# Patient Record
Sex: Female | Born: 1975 | Race: White | Hispanic: No | Marital: Single | Smoking: Current every day smoker
Health system: Southern US, Community
[De-identification: ages and names within clinical notes are randomized; demographics above are authoritative.]

---

## 2008-08-27 ENCOUNTER — Emergency Department: Payer: Self-pay | Admitting: Emergency Medicine

## 2015-10-14 ENCOUNTER — Encounter: Payer: Self-pay | Admitting: Emergency Medicine

## 2015-10-14 ENCOUNTER — Emergency Department: Payer: 59

## 2015-10-14 ENCOUNTER — Emergency Department
Admission: EM | Admit: 2015-10-14 | Discharge: 2015-10-14 | Disposition: A | Discharge status: Home / Self Care | Payer: 59 | Attending: Emergency Medicine | Admitting: Emergency Medicine

## 2015-10-14 DIAGNOSIS — F17210 Nicotine dependence, cigarettes, uncomplicated: Secondary | ICD-10-CM | POA: Insufficient documentation

## 2015-10-14 DIAGNOSIS — R42 Dizziness and giddiness: Secondary | ICD-10-CM | POA: Diagnosis present

## 2015-10-14 LAB — CBC
HCT: 43.9 % (ref 35.0–47.0)
Hemoglobin: 15.2 g/dL (ref 12.0–16.0)
MCH: 31.1 pg (ref 26.0–34.0)
MCHC: 34.6 g/dL (ref 32.0–36.0)
MCV: 90 fL (ref 80.0–100.0)
PLATELETS: 287 10*3/uL (ref 150–440)
RBC: 4.88 MIL/uL (ref 3.80–5.20)
RDW: 13.3 % (ref 11.5–14.5)
WBC: 9.1 10*3/uL (ref 3.6–11.0)

## 2015-10-14 LAB — URINALYSIS COMPLETE WITH MICROSCOPIC (ARMC ONLY)
BILIRUBIN URINE: NEGATIVE
GLUCOSE, UA: NEGATIVE mg/dL
KETONES UR: NEGATIVE mg/dL
Leukocytes, UA: NEGATIVE
NITRITE: NEGATIVE
Protein, ur: NEGATIVE mg/dL
Specific Gravity, Urine: 1.015 (ref 1.005–1.030)
pH: 7 (ref 5.0–8.0)

## 2015-10-14 LAB — BASIC METABOLIC PANEL
ANION GAP: 6 (ref 5–15)
BUN: 6 mg/dL (ref 6–20)
CALCIUM: 9.2 mg/dL (ref 8.9–10.3)
CO2: 26 mmol/L (ref 22–32)
CREATININE: 0.59 mg/dL (ref 0.44–1.00)
Chloride: 110 mmol/L (ref 101–111)
Glucose, Bld: 102 mg/dL — ABNORMAL HIGH (ref 65–99)
Potassium: 4 mmol/L (ref 3.5–5.1)
SODIUM: 142 mmol/L (ref 135–145)

## 2015-10-14 LAB — POCT PREGNANCY, URINE: PREG TEST UR: NEGATIVE

## 2015-10-14 LAB — GLUCOSE, CAPILLARY: Glucose-Capillary: 94 mg/dL (ref 65–99)

## 2015-10-14 MED ORDER — DIAZEPAM 5 MG PO TABS: 5.0000 mg | ORAL_TABLET | Freq: Three times a day (TID) | ORAL | Status: AC | PRN

## 2015-10-14 MED ORDER — MECLIZINE HCL 25 MG PO TABS
25.0000 mg | ORAL_TABLET | Freq: Once | ORAL | Status: AC
  Administered 2015-10-14: 25 mg via ORAL
  Filled 2015-10-14: qty 1

## 2015-10-14 MED ORDER — MECLIZINE HCL 25 MG PO TABS: 25.0000 mg | ORAL_TABLET | Freq: Three times a day (TID) | ORAL | Status: AC | PRN

## 2015-10-14 MED ORDER — DIAZEPAM 5 MG PO TABS
5.0000 mg | ORAL_TABLET | Freq: Once | ORAL | Status: AC
  Administered 2015-10-14: 5 mg via ORAL
  Filled 2015-10-14: qty 1

## 2015-10-14 NOTE — ED Provider Notes (Addendum)
-----------------------------------------   6:31 PM on 10/14/2015 -----------------------------------------  Patient still feels positional vertigo. She was signed out to me as pending MRI for ongoing vertiginous symptoms and unsteady gait. At this time she is neurological intact. She still feels vertiginous when we move her in the bed otherwise she has no complaints. She has brisk symmetric reflexes in lower extremities, strength and sensation are intact heel to shin is normal. 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 This is of course of concern for us. I talked to the patient she would like to see if we can figure out what that means I think that is not unreasonable. I do not think is contributing to her symptoms today interestingly. That is to say it is difficult for me to imagine that his cervical spine process could be causing true vertigo. Nonetheless, we will obtain the further imaging as recommended by radiology and we will then discuss with neurology depending on what we find. Patient not vomiting and is very current with this plan. Vital signs are stable.  Jeanmarie PlantJames A Zubair Lofton, MD 10/14/15 1832  ----------------------------------------- 7:12 PM on 10/14/2015 -----------------------------------------  Patient is Valerie Ross and demanding to be discharged at this time. She states she's been here too long and she does not want to stay. He understands them concern of a possible demyelinating disease in her cervical spine and she does not wish to stay for further evaluation. She remains neurologically intact and her only complaint is vertigo. We will ensure that she can ambulate. Given the patient refuses to stay for any further testing, obviously our only option is to discharge her. Patient is awake and alert and capable of making that decision. She does have a primary care doctor she will follow up with him tomorrow. She  is made aware of all the findings that we have here on her imaging etc. She understands she can return at any time she feels worse or changes her mind. She is advised not to drive or do anything else that may E dangerous if she is feeling vertigo.  Jeanmarie PlantJames A Stella Bortle, MD 10/14/15 [phone]

## 2015-10-14 NOTE — ED Provider Notes (Signed)
[facility] Emergency Department Provider Note  ____________________________________________  Time seen: Approximately 2:25 PM  I have reviewed the triage vital signs and the nursing notes.   HISTORY  Chief Complaint Dizziness   HPI Valerie Ross is a 40 y.o. female no significant past medical history who presents for evaluation of imbalance. Patient reports her symptoms started yesterday. She reports that she feels like she is on a boat and has been falling to the left. Her husband reports that every time she tries to walk she falls to the left side. Patient and husband deny slurred speech, facial droop, unilateral weakness or numbness. Patient also reports that she has been having intermittent vertigo when she moves last seen a few seconds at a time. She also feels pain in her left ear which started yesterday. Patient reports that these episodes of imbalance have been going on for a day and a half. She reports that yesterday evening she developed a severe headache that was occipital nonradiating and sharp, different than her normal headaches however that has resolved. She reports that she has a maternal grandmother who died of a brain aneurysm. Patient is a smoker and also smokes marijuana 2-3 times a day. Her husband has smoked the same marijuana 2 days ago and does not have similar symptoms. She reports that she drinks 2-3 drinks 2-3 times a week. She denies any family history of stroke. She currently has no headache. She denies   History reviewed. No pertinent past medical history.  There are no active problems to display for this patient.   History reviewed. No pertinent past surgical history.  Current Outpatient Rx  Name  Route  Sig  Dispense  Refill  . diazepam (VALIUM) 5 MG tablet   Oral   Take 1 tablet (5 mg total) by mouth every 8 (eight) hours as needed for anxiety.   10 tablet   0   . meclizine (ANTIVERT) 25 MG tablet   Oral   Take 1 tablet  (25 mg total) by mouth 3 (three) times daily as needed for dizziness.   30 tablet   0     Allergies Neosporin  No family history on file.  Social History Social History  Substance Use Topics  . Smoking status: Current Every Day Smoker -- 1.00 packs/day    Types: Cigarettes  . Smokeless tobacco: None  . Alcohol Use: Yes    Review of Systems  Constitutional: Negative for fever. Eyes: Negative for visual changes. ENT: Negative for sore throat. + left ear pain Cardiovascular: Negative for chest pain. Respiratory: Negative for shortness of breath. Gastrointestinal: Negative for abdominal pain, vomiting or diarrhea. Genitourinary: Negative for dysuria. Musculoskeletal: Negative for back pain. Skin: Negative for rash. Neurological: + HA and imbalance. No weakness or numbness.  ____________________________________________   PHYSICAL EXAM:  VITAL SIGNS: ED Triage Vitals  Enc Vitals Group     BP 10/14/15 1307 158/83 mmHg     Pulse Rate 10/14/15 1307 74     Resp 10/14/15 1307 18     Temp 10/14/15 1307 98.1 F (36.7 C)     Temp Source 10/14/15 1307 Oral     SpO2 10/14/15 1307 97 %     Weight 10/14/15 1307 222 lb (100.699 kg)     Height 10/14/15 1307 5\' 9"  (1.753 m)     Head Cir --      Peak Flow --      Pain Score 10/14/15 1307 3  Pain Loc --      Pain Edu? --      Excl. in [place]? --     Constitutional: Alert and oriented. Well appearing and in no apparent distress. HEENT:      Head: Normocephalic and atraumatic.         Eyes: Conjunctivae are normal. Sclera is non-icteric. EOMI. PERRL      Ear: TMs visualized bilaterally and normal      Mouth/Throat: Mucous membranes are moist.       Neck: Supple with no signs of meningismus. Cardiovascular: Regular rate and rhythm. No murmurs, gallops, or rubs. 2+ symmetrical distal pulses are present in all extremities. No JVD. Respiratory: Normal respiratory effort. Lungs are clear to auscultation bilaterally. No wheezes,  crackles, or rhonchi.  Gastrointestinal: Soft, non tender, and non distended with positive bowel sounds. No rebound or guarding. Genitourinary: No suprapubic tenderness. No CVA tenderness. Musculoskeletal: Nontender with normal range of motion in all extremities. No edema, cyanosis, or erythema of extremities. Neurologic: A & O x3, PERRL, no nystagmus, negative Dix-Halpike, CN II-XII intact, motor testing reveals good tone and bulk throughout. There is no evidence of pronator drift or dysmetria on finger to nose finger and heel to shin. Muscle strength is 5/5 throughout. Deep tendon reflexes are 2+ throughout with downgoing toes. Sensory examination is intact. Gait is wide-based and the patient falls to the left Skin: Skin is warm, dry and intact. No rash noted. Psychiatric: Mood and affect are normal. Speech and behavior are normal.  ____________________________________________   LABS (all labs ordered are listed, but only abnormal results are displayed)  Labs Reviewed  BASIC METABOLIC PANEL - Abnormal; Notable for the following:    Glucose, Bld 102 (*)    All other components within normal limits  URINALYSIS COMPLETEWITH MICROSCOPIC ([facility] ONLY) - Abnormal; Notable for the following:    Color, Urine YELLOW (*)    APPearance CLOUDY (*)    Hgb urine dipstick 2+ (*)    Bacteria, UA RARE (*)    Squamous Epithelial / LPF 0-5 (*)    All other components within normal limits  CBC  GLUCOSE, CAPILLARY  CBG MONITORING, ED  POC URINE PREG, ED  POCT PREGNANCY, URINE   ____________________________________________  EKG  ED ECG REPORT I, Nita Sickle, the attending physician, personally viewed and interpreted this ECG.  Normal sinus rhythm, rate of 73, normal intervals, normal axis, no ST elevations or depressions, flattening T waves in lead 3. No prior for comparison ____________________________________________  RADIOLOGY  CT Head: negative.  MRI head:  pending ____________________________________________   PROCEDURES  Procedure(s) performed: None Critical Care performed:  None ____________________________________________   INITIAL IMPRESSION / ASSESSMENT AND PLAN / ED COURSE  40 y.o. female no significant past medical history who presents for evaluation of imbalance and difficulty ambulating as she is falling to the left side for the last 30 hours. Patient also had a severe headache at 12 hours into these symptoms which has now resolved. On exam she is well appearing, no distress, her neurological exam is intact including no nystagmus, normal bilateral elevation of the palate, normal speech, normal extraocular movements, and a negative [employer]. Patient does have a wide gait and falls to the left with ambulation. Plan for head CT and if that's negative plan for an MRI to rule out stroke or central cause of her symptoms. Since patient also endorses vertigo, will start her on valium and meclizine.   ED course: MRI pending. Discussed results  of head CT with patient. MRI and meds pending. Care transferred to Dr. Alphonzo LemmingsMcShane at 4:40PM.  Pertinent labs & imaging results that were available during my care of the patient were reviewed by me and considered in my medical decision making (see chart for details).    ____________________________________________   FINAL CLINICAL IMPRESSION(S) / ED DIAGNOSES  Final diagnoses:  Dizziness      NEW MEDICATIONS STARTED DURING THIS VISIT:  Discharge Medication List as of 10/14/2015  7:15 PM    START taking these medications   Details  diazepam (VALIUM) 5 MG tablet Take 1 tablet (5 mg total) by mouth every 8 (eight) hours as needed for anxiety., Starting 10/14/2015, Until Thu 10/13/16, Print    meclizine (ANTIVERT) 25 MG tablet Take 1 tablet (25 mg total) by mouth 3 (three) times daily as needed for dizziness., Starting 10/14/2015, Until Discontinued, Print         Note:  This document was  prepared using Dragon voice recognition software and may include unintentional dictation errors.    Nita Sicklearolina Brittanni Cariker, MD 10/14/15 2019

## 2015-10-14 NOTE — Discharge Instructions (Signed)
Take valium as prescribed. Do not drink alcohol, drive or participate in any other potentially dangerous activities while taking this medication as it may make you sleepy. Do not take this medication with any other sedating medications, either prescription or over-the-counter. We did notice that you have a possible demyelinating process in your cervical spine. This could be quite serious although the exact nature of this is not clear. For this reason we wanted to keep you for further imaging but you would prefer to go home. This is certainly your choice however limits what we can evaluate. It is essential that you follow up with your primary care doctor tomorrow. If you change your mind or feel worse in any way, return to the emergency department.    Dizziness Dizziness is a common problem. It is a feeling of unsteadiness or light-headedness. You may feel like you are about to faint. Dizziness can lead to injury if you stumble or fall. Anyone can become dizzy, but dizziness is more common in older adults. This condition can be caused by a number of things, including medicines, dehydration, or illness. HOME CARE INSTRUCTIONS Taking these steps may help with your condition: Eating and Drinking  Drink enough fluid to keep your urine clear or pale yellow. This helps to keep you from becoming dehydrated. Try to drink more clear fluids, such as water.  Do not drink alcohol.  Limit your caffeine intake if directed by your health care provider.  Limit your salt intake if directed by your health care provider. Activity  Avoid making quick movements.  Rise slowly from chairs and steady yourself until you feel okay.  In the morning, first sit up on the side of the bed. When you feel okay, stand slowly while you hold onto something until you know that your balance is fine.  Move your legs often if you need to stand in one place for a long time. Tighten and relax your muscles in your legs while you are  standing.  Do not drive or operate heavy machinery if you feel dizzy.  Avoid bending down if you feel dizzy. Place items in your home so that they are easy for you to reach without leaning over. Lifestyle  Do not use any tobacco products, including cigarettes, chewing tobacco, or electronic cigarettes. If you need help quitting, ask your health care provider.  Try to reduce your stress level, such as with yoga or meditation. Talk with your health care provider if you need help. General Instructions  Watch your dizziness for any changes.  Take medicines only as directed by your health care provider. Talk with your health care provider if you think that your dizziness is caused by a medicine that you are taking.  Tell a friend or a family member that you are feeling dizzy. If he or she notices any changes in your behavior, have this person call your health care provider.  Keep all follow-up visits as directed by your health care provider. This is important. SEEK MEDICAL CARE IF:  Your dizziness does not go away.  Your dizziness or light-headedness gets worse.  You feel nauseous.  You have reduced hearing.  You have new symptoms.  You are unsteady on your feet or you feel like the room is spinning. SEEK IMMEDIATE MEDICAL CARE IF:  You vomit or have diarrhea and are unable to eat or drink anything.  You have problems talking, walking, swallowing, or using your arms, hands, or legs.  You feel generally weak.  You are not thinking clearly or you have trouble forming sentences. It may take a friend or family member to notice this.  You have chest pain, abdominal pain, shortness of breath, or sweating.  Your vision changes.  You notice any bleeding.  You have a headache.  You have neck pain or a stiff neck.  You have a fever.   This information is not intended to replace advice given to you by your health care provider. Make sure you discuss any questions you have with  your health care provider.   Document Released: 09/21/2000 Document Revised: 08/12/2014 Document Reviewed: 03/24/2014 Elsevier Interactive Patient Education [employer]2016 Elsevier Inc.

## 2015-10-14 NOTE — ED Notes (Signed)
Pt presents to ED with reports of dizziness, losing her balance and left ear pain sensitivity that began yesterday. Pt alert and oriented in triage. Neuro exam WNL. NIH scale negative.

## 2015-10-16 ENCOUNTER — Other Ambulatory Visit: Payer: Self-pay | Admitting: Family Medicine

## 2015-10-16 DIAGNOSIS — G959 Disease of spinal cord, unspecified: Secondary | ICD-10-CM

## 2015-10-17 ENCOUNTER — Ambulatory Visit (HOSPITAL_COMMUNITY)
Admission: RE | Admit: 2015-10-17 | Discharge: 2015-10-17 | Disposition: A | Discharge status: Home / Self Care | Payer: 59 | Source: Ambulatory Visit | Attending: Family Medicine | Admitting: Family Medicine

## 2015-10-17 DIAGNOSIS — M4804 Spinal stenosis, thoracic region: Secondary | ICD-10-CM | POA: Insufficient documentation

## 2015-10-17 DIAGNOSIS — G959 Disease of spinal cord, unspecified: Secondary | ICD-10-CM

## 2015-10-17 DIAGNOSIS — M5134 Other intervertebral disc degeneration, thoracic region: Secondary | ICD-10-CM | POA: Insufficient documentation

## 2015-10-17 MED ORDER — GADOBENATE DIMEGLUMINE 529 MG/ML IV SOLN
20.0000 mL | Freq: Once | INTRAVENOUS | Status: AC | PRN
  Administered 2015-10-17: 20 mL via INTRAVENOUS

## 2017-01-20 DIAGNOSIS — Z23 Encounter for immunization: Secondary | ICD-10-CM | POA: Diagnosis not present

## 2017-11-01 DIAGNOSIS — Z124 Encounter for screening for malignant neoplasm of cervix: Secondary | ICD-10-CM | POA: Diagnosis not present

## 2017-11-01 DIAGNOSIS — G43909 Migraine, unspecified, not intractable, without status migrainosus: Secondary | ICD-10-CM | POA: Diagnosis not present

## 2017-11-01 DIAGNOSIS — I10 Essential (primary) hypertension: Secondary | ICD-10-CM | POA: Diagnosis not present

## 2017-11-01 DIAGNOSIS — Z0000 Encounter for general adult medical examination without abnormal findings: Secondary | ICD-10-CM | POA: Diagnosis not present

## 2017-11-02 DIAGNOSIS — I10 Essential (primary) hypertension: Secondary | ICD-10-CM | POA: Diagnosis not present

## 2017-11-14 DIAGNOSIS — N390 Urinary tract infection, site not specified: Secondary | ICD-10-CM | POA: Diagnosis not present

## 2017-11-14 DIAGNOSIS — D72829 Elevated white blood cell count, unspecified: Secondary | ICD-10-CM | POA: Diagnosis not present

## 2017-12-15 DIAGNOSIS — I10 Essential (primary) hypertension: Secondary | ICD-10-CM | POA: Diagnosis not present

## 2018-01-11 DIAGNOSIS — Z23 Encounter for immunization: Secondary | ICD-10-CM | POA: Diagnosis not present

## 2018-06-18 DIAGNOSIS — I10 Essential (primary) hypertension: Secondary | ICD-10-CM | POA: Diagnosis not present

## 2018-11-28 ENCOUNTER — Emergency Department: Payer: 59

## 2018-11-28 ENCOUNTER — Emergency Department
Admission: EM | Admit: 2018-11-28 | Discharge: 2018-11-28 | Disposition: A | Discharge status: Home / Self Care | Payer: 59 | Attending: Emergency Medicine | Admitting: Emergency Medicine

## 2018-11-28 ENCOUNTER — Other Ambulatory Visit: Payer: Self-pay

## 2018-11-28 ENCOUNTER — Encounter: Payer: Self-pay | Admitting: Emergency Medicine

## 2018-11-28 DIAGNOSIS — R079 Chest pain, unspecified: Secondary | ICD-10-CM | POA: Diagnosis present

## 2018-11-28 DIAGNOSIS — R0789 Other chest pain: Secondary | ICD-10-CM | POA: Diagnosis not present

## 2018-11-28 DIAGNOSIS — F17210 Nicotine dependence, cigarettes, uncomplicated: Secondary | ICD-10-CM | POA: Insufficient documentation

## 2018-11-28 DIAGNOSIS — Z79899 Other long term (current) drug therapy: Secondary | ICD-10-CM | POA: Insufficient documentation

## 2018-11-28 LAB — BASIC METABOLIC PANEL
Anion gap: 7 (ref 5–15)
BUN: <5 mg/dL — ABNORMAL LOW (ref 6–20)
CO2: 23 mmol/L (ref 22–32)
Calcium: 8.7 mg/dL — ABNORMAL LOW (ref 8.9–10.3)
Chloride: 107 mmol/L (ref 98–111)
Creatinine, Ser: 0.65 mg/dL (ref 0.44–1.00)
GFR calc Af Amer: >60 mL/min (ref >60)
GFR calc non Af Amer: >60 mL/min (ref >60)
Glucose, Bld: 98 mg/dL (ref 70–99)
Potassium: 3.3 mmol/L — ABNORMAL LOW (ref 3.5–5.1)
Sodium: 137 mmol/L (ref 135–145)

## 2018-11-28 LAB — CBC
HCT: 43.5 % (ref 36.0–46.0)
Hemoglobin: 14.9 g/dL (ref 12.0–15.0)
MCH: 31.3 pg (ref 26.0–34.0)
MCHC: 34.3 g/dL (ref 30.0–36.0)
MCV: 91.4 fL (ref 80.0–100.0)
Platelets: 282 10*3/uL (ref 150–400)
RBC: 4.76 MIL/uL (ref 3.87–5.11)
RDW: 12.6 % (ref 11.5–15.5)
WBC: 9 10*3/uL (ref 4.0–10.5)
nRBC: 0 % (ref 0.0–0.2)

## 2018-11-28 LAB — TROPONIN I (HIGH SENSITIVITY): Troponin I (High Sensitivity): <2 ng/L (ref <18)

## 2018-11-28 MED ORDER — POTASSIUM CHLORIDE CRYS ER 20 MEQ PO TBCR
20.0000 meq | EXTENDED_RELEASE_TABLET | Freq: Once | ORAL | Status: AC
  Administered 2018-11-28: 20 meq via ORAL
  Filled 2018-11-28: qty 1

## 2018-11-28 NOTE — ED Notes (Signed)
Pt ambulatory from triage with steady gait in NAD, A&Ox4. PT states she has a hx of anxiety so she took a xanax to see if anxiety was causing her symptoms and it did not help. Reports tightness in chest and pain in right arm and shoulder

## 2018-11-28 NOTE — ED Triage Notes (Signed)
PT arrives with complaints of central chest tightness that started yesterday. PT states the intensity is intermittent.

## 2018-11-28 NOTE — Discharge Instructions (Addendum)
Your work-up was reassuring.  You had a slightly low potassium at 3.3.  Your other labs were very normal.  No evidence of a heart attack.  If you develop worsening shortness of breath or worsening discomfort you should return immediately to the ER

## 2018-11-28 NOTE — ED Provider Notes (Signed)
[facility] Emergency Department Provider Note  ____________________________________________   First MD Initiated Contact with Patient 11/28/18 1430     (approximate)  I have reviewed the triage vital signs and the nursing notes.   HISTORY  Chief Complaint Chest Pain    HPI Valerie Ross is a 43 y.o. female with anxiety who presents with chest pain.  Patient versus having some midsternal chest pain that started last night.  Pain is intermittent, nothing brings it on, perhaps some soreness in her right shoulder as well as well as some soreness in the upper part of her right leg no weakness.  No numbness or tingling.  No recent falls.  Denies any shortness of breath, cough, fevers.  Patient does endorse having a history of anxiety and having more stress recently.  She is not sure if it is related to this.  Denies any facial changes, changes in speech.          History reviewed. No pertinent past medical history.  There are no active problems to display for this patient.   History reviewed. No pertinent surgical history.  Prior to Admission medications   Medication Sig Start Date End Date Taking? Authorizing Provider  meclizine (ANTIVERT) 25 MG tablet Take 1 tablet (25 mg total) by mouth 3 (three) times daily as needed for dizziness. 10/14/15   Rudene Re, MD    Allergies Neosporin [neomycin-bacitracin zn-polymyx]  No family history on file.  Social History Social History   Tobacco Use  . Smoking status: Current Every Day Smoker    Packs/day: 1.00    Types: Cigarettes  Substance Use Topics  . Alcohol use: Yes  . Drug use: Yes    Types: Marijuana      Review of Systems Constitutional: No fever/chills Eyes: No visual changes. ENT: No sore throat. Cardiovascular: Positive chest pain Respiratory: Denies shortness of breath. Gastrointestinal: No abdominal pain.  No nausea, no vomiting.  No diarrhea.  No constipation.  Genitourinary: Negative for dysuria. Musculoskeletal: Negative for back pain.  Positive soreness in her right shoulder and her right thigh Skin: Negative for rash. Neurological: Negative for headaches, focal weakness or numbness. All other ROS negative ____________________________________________   PHYSICAL EXAM:  VITAL SIGNS: ED Triage Vitals [11/28/18 1254]  Enc Vitals Group     BP (!) 153/101     Pulse Rate 99     Resp 18     Temp 98.5 F (36.9 C)     Temp Source Oral     SpO2 98 %     Weight 225 lb (102.1 kg)     Height 5\' 9"  (1.753 m)     Head Circumference      Peak Flow      Pain Score 2     Pain Loc      Pain Edu?      Excl. in [place]?     Constitutional: Alert and oriented. Well appearing and in no acute distress. Eyes: Conjunctivae are normal. EOMI. Head: Atraumatic. Nose: No congestion/rhinnorhea. Mouth/Throat: Mucous membranes are moist.   Neck: No stridor. Trachea Midline. FROM Cardiovascular: Normal rate, regular rhythm. Grossly normal heart sounds.  Good peripheral circulation.  Pedal pulses are equal.  Radial pulses are equal.  Equal blood pressures on her bilateral arms Respiratory: Normal respiratory effort.  No retractions. Lungs CTAB. Gastrointestinal: Soft and nontender. No distention. No abdominal bruits.  Musculoskeletal: No lower extremity tenderness nor edema.  No joint effusions. Neurologic:  Normal speech and  language. No gross focal neurologic deficits are appreciated.  Skin:  Skin is warm, dry and intact. No rash noted. Psychiatric: Mood and affect are normal. Speech and behavior are normal. GU: Deferred   ____________________________________________   LABS (all labs ordered are listed, but only abnormal results are displayed)  Labs Reviewed  BASIC METABOLIC PANEL - Abnormal; Notable for the following components:      Result Value   Potassium 3.3 (*)    BUN <5 (*)    Calcium 8.7 (*)    All other components within normal limits  CBC   TROPONIN I (HIGH SENSITIVITY)  TROPONIN I (HIGH SENSITIVITY)   ____________________________________________   ED ECG REPORT I, Concha SeMary E Lenise Jr, the attending physician, personally viewed and interpreted this ECG.  EKG is sinus tachycardia rate of 102, no ST elevation, no T wave inversion, normal intervals ____________________________________________  RADIOLOGY Vela ProseI, Julia Kulzer E Biance Moncrief, personally viewed and evaluated these images (plain radiographs) as part of my medical decision making, as well as reviewing the written report by the radiologist.  ED MD interpretation: Chest x-ray was negative for pneumonia  Official radiology report(s): Dg Chest 2 View  Result Date: 11/28/2018 CLINICAL DATA:  Chest pain EXAM: CHEST - 2 VIEW COMPARISON:  None. FINDINGS: The heart size and mediastinal contours are within normal limits. Both lungs are clear. The visualized skeletal structures are unremarkable. IMPRESSION: No active cardiopulmonary disease. Electronically Signed   By: Duanne GuessNicholas  Plundo M.D.   On: 11/28/2018 13:26    ____________________________________________   PROCEDURES  Procedure(s) performed (including Critical Care):  Procedures   ____________________________________________   INITIAL IMPRESSION / ASSESSMENT AND PLAN / ED COURSE   Mitzi HansenLaura Marcum was evaluated in Emergency Department on 11/28/2018 for the symptoms described in the history of present illness. She was evaluated in the context of the global COVID-19 pandemic, which necessitated consideration that the patient might be at risk for infection with the SARS-CoV-2 virus that causes COVID-19. Institutional protocols and algorithms that pertain to the evaluation of patients at risk for COVID-19 are in a state of rapid change based on information released by regulatory bodies including the CDC and federal and state organizations. These policies and algorithms were followed during the patient's care in the ED.    Most Likely DDx:   -MSK (atypical chest pain) but will get cardiac markers to evaluate for ACS.    DDx that was also considered d/t potential to cause harm, but was found less likely based on history and physical (as detailed above): -PNA (no fevers, cough but CXR to evaluate) -PNX (reassured with equal b/l breath sounds, CXR to evaluate) -Symptomatic anemia (will get H&H) -Pulmonary embolism as no sob at rest, not pleuritic in nature, no hypoxia, [facility] due to a heart rate of 102 however I had a discussion with patient and she says that her heart rates normally run a little bit elevated and she is on metoprolol for it.  She denied any other risk factors besides smoking given she is not having shortness of breath she did not want to have a d-dimer done or CT imaging. -Aortic Dissection as no tearing pain and no radiation to the mid back, pulses equal. -Pericarditis no rub on exam, EKG changes or hx to suggest dx -Tamponade (no notable SOB, tachycardic, hypotensive) -Esophageal rupture (no h/o diffuse vomitting/no crepitus)  Unclear what is the cause of patient's discomfort in her right shoulder as well as her right leg.  However she has no other  evidence of weakness or sensation changes.  Her stroke score was 0.  I have very low suspicion this pain is secondary to a stroke given its only pain in the shoulder and pain in the upper thigh and there is no weakness on exam.  We also had a very lengthy discussion about this being a dissection although her blood pressures are only slightly elevated pulses were equal, blood pressures were equal and chest x-ray was normal.  We discussed CT imaging however given the low suspicion she would like to hold off on this at this time.  She feels like some of her symptoms are more likely related to her anxiety increased stress that she is had recently.  Patient understands that if her symptoms are worsening or change in any way that she should return immediately to the ER.  At  this time she would like to hold off on further imaging.  Cardiac markers to rule her out for ACS.  Patient is a very low heart score.  K slightly low at 3.3 and will replete.   _______________________________________   FINAL CLINICAL IMPRESSION(S) / ED DIAGNOSES   Final diagnoses:  Atypical chest pain     MEDICATIONS GIVEN DURING THIS VISIT:  Medications  potassium chloride SA (K-DUR) CR tablet 20 mEq (has no administration in time range)     ED Discharge Orders    None       Note:  This document was prepared using Dragon voice recognition software and may include unintentional dictation errors.   Concha SeFunke, Tekeisha Hakim E, MD 11/28/18 [phone]

## 2020-05-13 IMAGING — CR CHEST - 2 VIEW
1 series · 2 of 2 positions shown · non-contrast
Comparison: None.

CLINICAL DATA: Chest pain

EXAM:
CHEST - 2 VIEW

[Series 1: dg chest 2 view · 0.14mm/px · 2 of 2 slices shown]
[im 1/2]
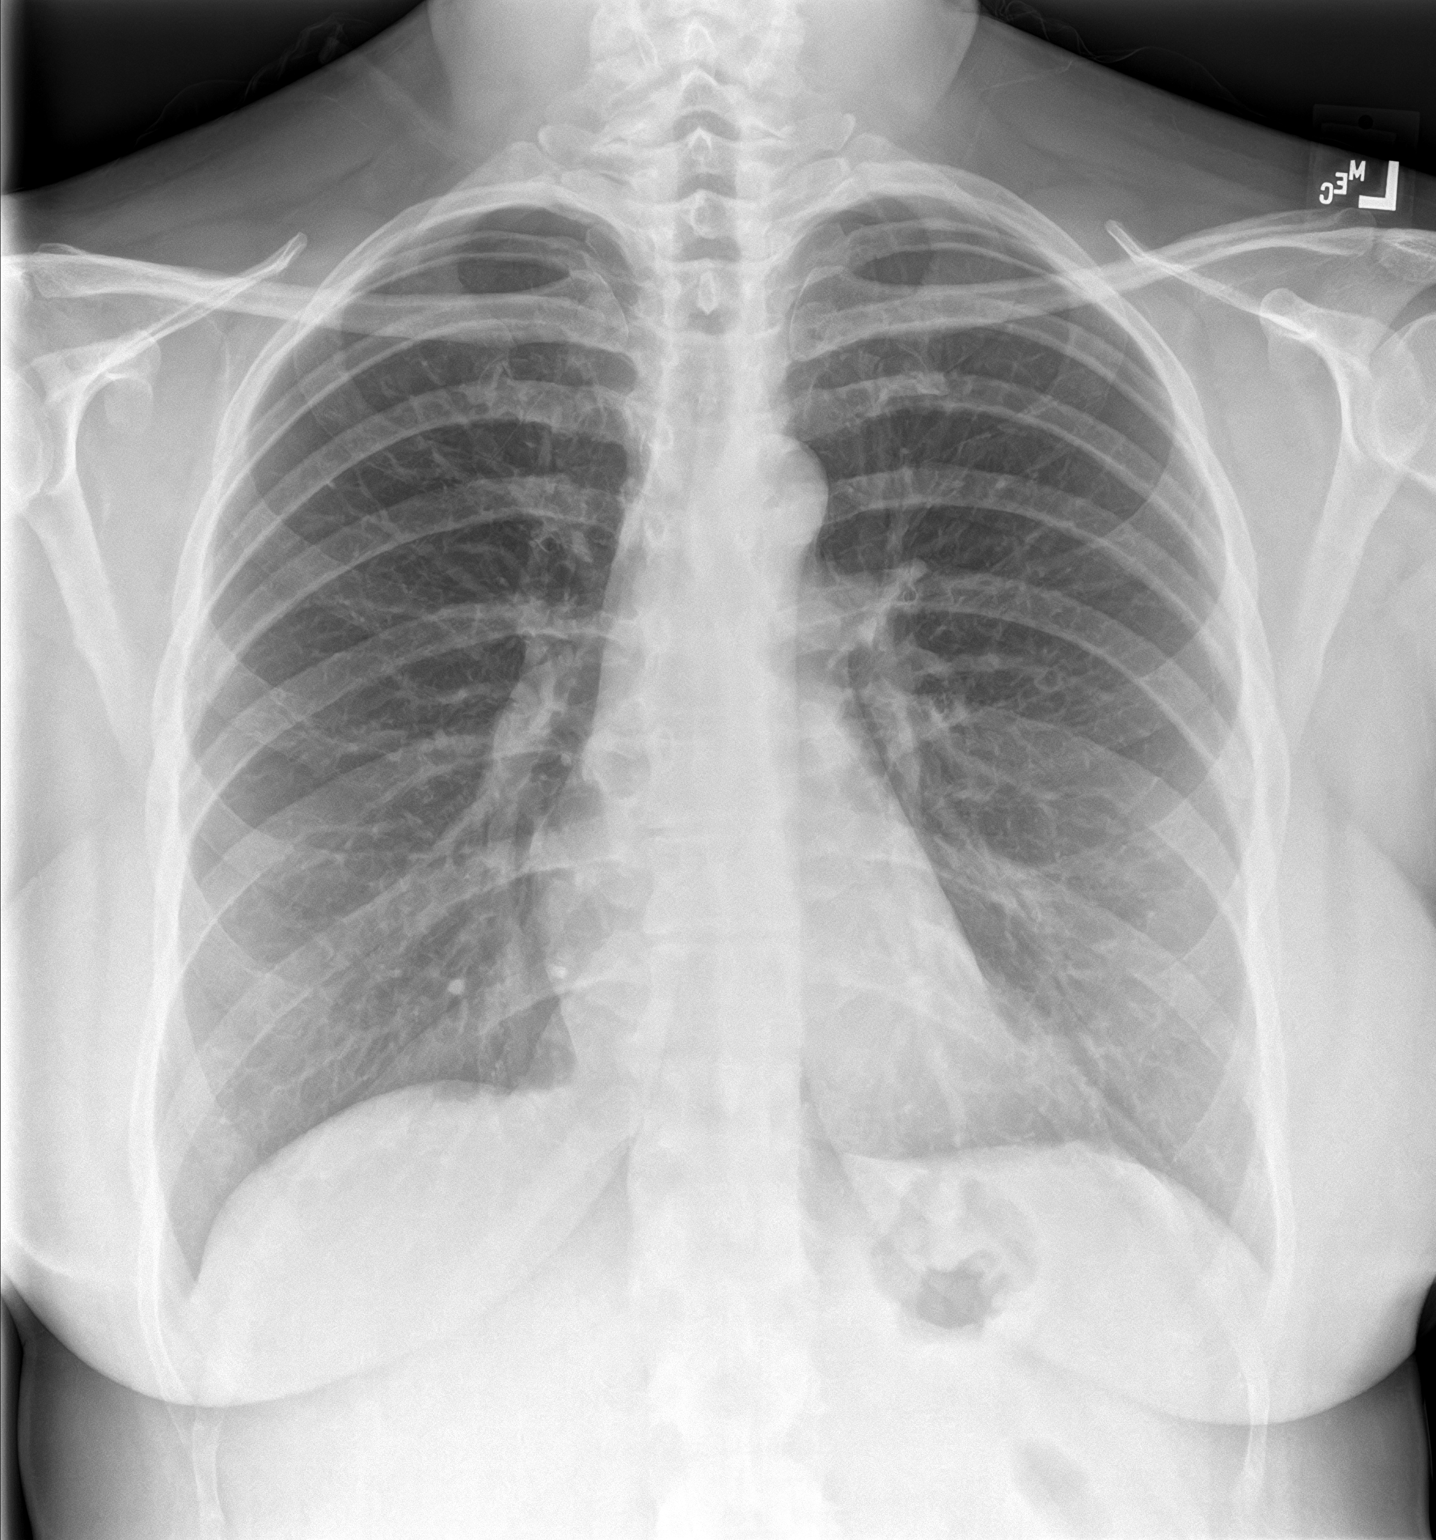
[im 2/2]
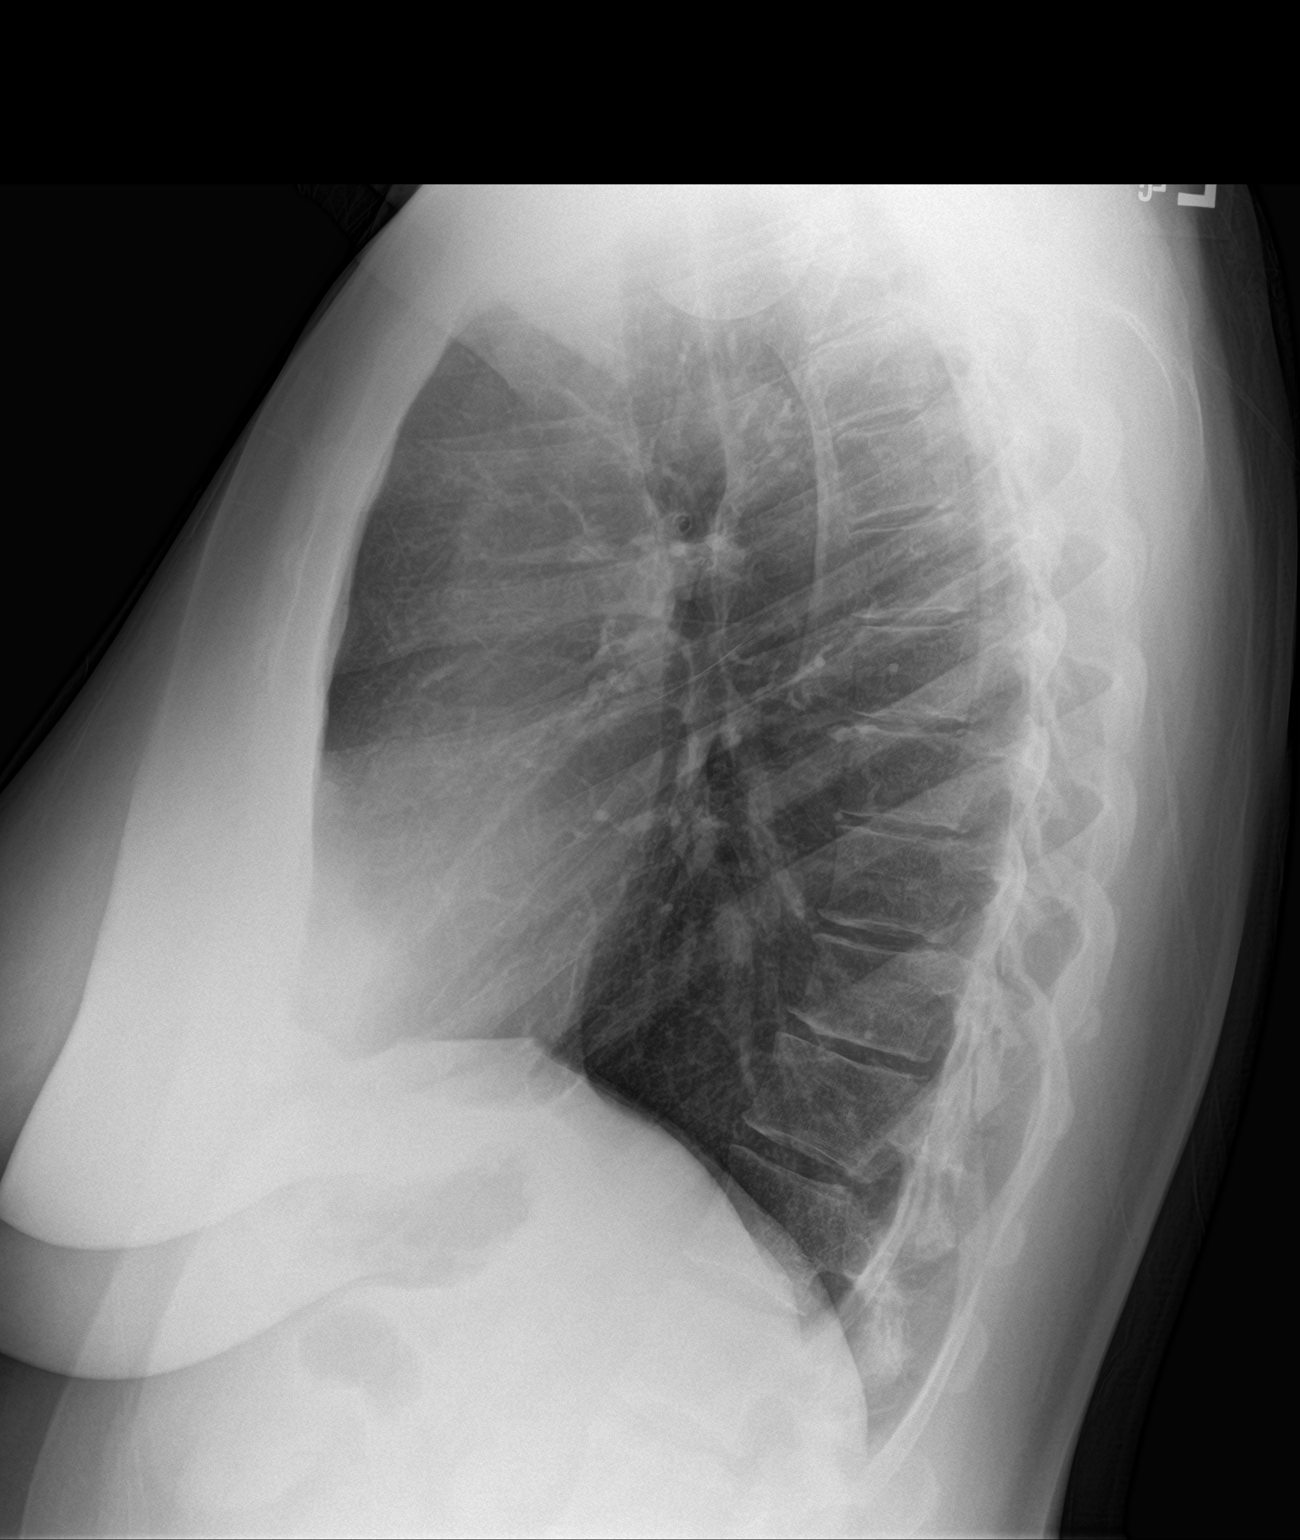

[2 of 2 positions shown; findings below may reference images not displayed]

FINDINGS: The heart size and mediastinal contours are within normal limits.
Both lungs are clear. The visualized skeletal structures are
unremarkable.
IMPRESSION: No active cardiopulmonary disease.
